# Patient Record
Sex: Female | Born: 1995 | Race: White | Hispanic: No | Marital: Single
Health system: Southern US, Community
[De-identification: ages and names within clinical notes are randomized; demographics above are authoritative.]

---
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
## 2017-04-15 ENCOUNTER — Other Ambulatory Visit (HOSPITAL_COMMUNITY)
Admission: RE | Admit: 2017-04-15 | Discharge: 2017-04-15 | Disposition: A | Discharge status: Home / Self Care | Payer: Medicaid Other | Source: Ambulatory Visit | Attending: Obstetrics & Gynecology | Admitting: Obstetrics & Gynecology

## 2017-04-15 ENCOUNTER — Ambulatory Visit (INDEPENDENT_AMBULATORY_CARE_PROVIDER_SITE_OTHER): Payer: Medicaid Other | Admitting: Obstetrics & Gynecology

## 2017-04-15 DIAGNOSIS — Z3202 Encounter for pregnancy test, result negative: Secondary | ICD-10-CM | POA: Diagnosis not present

## 2017-04-15 DIAGNOSIS — Z3049 Encounter for surveillance of other contraceptives: Secondary | ICD-10-CM | POA: Diagnosis not present

## 2017-04-15 DIAGNOSIS — Z30019 Encounter for initial prescription of contraceptives, unspecified: Secondary | ICD-10-CM | POA: Diagnosis not present

## 2017-04-15 LAB — POCT PREGNANCY, URINE: PREG TEST UR: NEGATIVE

## 2017-04-15 MED ORDER — ETONOGESTREL 68 MG ~~LOC~~ IMPL
68.0000 mg | DRUG_IMPLANT | Freq: Once | SUBCUTANEOUS | Status: AC
  Administered 2017-04-15: 68 mg via SUBCUTANEOUS

## 2017-04-15 NOTE — Progress Notes (Signed)
Patient given informed consent, she signed consent form. Pregnancy test was negative.  Appropriate time out taken.  Patient's left arm was prepped and draped in the usual sterile fashion.. The ruler used to measure and mark insertion area.  Patient was prepped with alcohol swab and then injected with 5 ml of 1 % lidocaine.  She was prepped with betadine, Nexplanon removed from packaging,  Device confirmed in needle, then inserted full length of needle and withdrawn per handbook instructions.  There was minimal blood loss.  Patient insertion site covered with guaze and a pressure bandage to reduce any bruising.  The patient tolerated the procedure well and was given post procedure instructions. Return in about one month for Nexplanon check.  STI screen  Kailin Principato L. Harraway-Smith, M.D., [facility]AC[facility]

## 2017-04-15 NOTE — Patient Instructions (Signed)
Etonogestrel implant What is this medicine? ETONOGESTREL (et oh noe JES trel) is a contraceptive (birth control) device. It is used to prevent pregnancy. It can be used for up to 3 years. This medicine may be used for other purposes; ask your health care provider or pharmacist if you have questions. COMMON BRAND NAME(S): Implanon, Nexplanon What should I tell my health care provider before I take this medicine? They need to know if you have any of these conditions: -abnormal vaginal bleeding -blood vessel disease or blood clots -cancer of the breast, cervix, or liver -depression -diabetes -gallbladder disease -headaches -heart disease or recent heart attack -high blood pressure -high cholesterol -kidney disease -liver disease -renal disease -seizures -tobacco smoker -an unusual or allergic reaction to etonogestrel, other hormones, anesthetics or antiseptics, medicines, foods, dyes, or preservatives -pregnant or trying to get pregnant -breast-feeding How should I use this medicine? This device is inserted just under the skin on the inner side of your upper arm by a health care professional. Talk to your pediatrician regarding the use of this medicine in children. Special care may be needed. Overdosage: If you think you have taken too much of this medicine contact a poison control center or emergency room at once. NOTE: This medicine is only for you. Do not share this medicine with others. What if I miss a dose? This does not apply. What may interact with this medicine? Do not take this medicine with any of the following medications: -amprenavir -bosentan -fosamprenavir This medicine may also interact with the following medications: -barbiturate medicines for inducing sleep or treating seizures -certain medicines for fungal infections like ketoconazole and itraconazole -grapefruit juice -griseofulvin -medicines to treat seizures like carbamazepine, felbamate, oxcarbazepine,  phenytoin, topiramate -modafinil -phenylbutazone -rifampin -rufinamide -some medicines to treat HIV infection like atazanavir, indinavir, lopinavir, nelfinavir, tipranavir, ritonavir -St. John's wort This list may not describe all possible interactions. Give your health care provider a list of all the medicines, herbs, non-prescription drugs, or dietary supplements you use. Also tell them if you smoke, drink alcohol, or use illegal drugs. Some items may interact with your medicine. What should I watch for while using this medicine? This product does not protect you against HIV infection (AIDS) or other sexually transmitted diseases. You should be able to feel the implant by pressing your fingertips over the skin where it was inserted. Contact your doctor if you cannot feel the implant, and use a non-hormonal birth control method (such as condoms) until your doctor confirms that the implant is in place. If you feel that the implant may have broken or become bent while in your arm, contact your healthcare provider. What side effects may I notice from receiving this medicine? Side effects that you should report to your doctor or health care professional as soon as possible: -allergic reactions like skin rash, itching or hives, swelling of the face, lips, or tongue -breast lumps -changes in emotions or moods -depressed mood -heavy or prolonged menstrual bleeding -pain, irritation, swelling, or bruising at the insertion site -scar at site of insertion -signs of infection at the insertion site such as fever, and skin redness, pain or discharge -signs of pregnancy -signs and symptoms of a blood clot such as breathing problems; changes in vision; chest pain; severe, sudden headache; pain, swelling, warmth in the leg; trouble speaking; sudden numbness or weakness of the face, arm or leg -signs and symptoms of liver injury like dark yellow or brown urine; general ill feeling or flu-like symptoms;  light-colored   stools; loss of appetite; nausea; right upper belly pain; unusually weak or tired; yellowing of the eyes or skin -unusual vaginal bleeding, discharge -signs and symptoms of a stroke like changes in vision; confusion; trouble speaking or understanding; severe headaches; sudden numbness or weakness of the face, arm or leg; trouble walking; dizziness; loss of balance or coordination Side effects that usually do not require medical attention (report to your doctor or health care professional if they continue or are bothersome): -acne -back pain -breast pain -changes in weight -dizziness -general ill feeling or flu-like symptoms -headache -irregular menstrual bleeding -nausea -sore throat -vaginal irritation or inflammation This list may not describe all possible side effects. Call your doctor for medical advice about side effects. You may report side effects to FDA at 1-800-FDA-1088. Where should I keep my medicine? This drug is given in a hospital or clinic and will not be stored at home. NOTE: This sheet is a summary. It may not cover all possible information. If you have questions about this medicine, talk to your doctor, pharmacist, or health care provider.  2018 [employer] (2016-03-22 11:19:22)  

## 2017-04-16 LAB — GC/CHLAMYDIA PROBE AMP (~~LOC~~) NOT AT ARMC
Chlamydia: NEGATIVE
Neisseria Gonorrhea: NEGATIVE
